# Patient Record
Sex: Male | Born: 1950 | Race: White | Hispanic: No | Marital: Single | Smoking: Never smoker
Health system: Southern US, Community
[De-identification: ages and names within clinical notes are randomized; demographics above are authoritative.]

## PROBLEM LIST (undated history)

## (undated) DIAGNOSIS — R350 Frequency of micturition: Secondary | ICD-10-CM

## (undated) DIAGNOSIS — R972 Elevated prostate specific antigen [PSA]: Secondary | ICD-10-CM

## (undated) DIAGNOSIS — C61 Malignant neoplasm of prostate: Secondary | ICD-10-CM

## (undated) DIAGNOSIS — R030 Elevated blood-pressure reading, without diagnosis of hypertension: Secondary | ICD-10-CM

## (undated) DIAGNOSIS — H9319 Tinnitus, unspecified ear: Secondary | ICD-10-CM

## (undated) DIAGNOSIS — I10 Essential (primary) hypertension: Secondary | ICD-10-CM

## (undated) DIAGNOSIS — N529 Male erectile dysfunction, unspecified: Secondary | ICD-10-CM

## (undated) DIAGNOSIS — H903 Sensorineural hearing loss, bilateral: Secondary | ICD-10-CM

## (undated) DIAGNOSIS — N200 Calculus of kidney: Secondary | ICD-10-CM

## (undated) DIAGNOSIS — R3915 Urgency of urination: Secondary | ICD-10-CM

## (undated) DIAGNOSIS — Z974 Presence of external hearing-aid: Secondary | ICD-10-CM

## (undated) DIAGNOSIS — R351 Nocturia: Secondary | ICD-10-CM

## (undated) DIAGNOSIS — Z9289 Personal history of other medical treatment: Secondary | ICD-10-CM

## (undated) DIAGNOSIS — N401 Enlarged prostate with lower urinary tract symptoms: Secondary | ICD-10-CM

## (undated) DIAGNOSIS — J342 Deviated nasal septum: Secondary | ICD-10-CM

## (undated) DIAGNOSIS — Z973 Presence of spectacles and contact lenses: Secondary | ICD-10-CM

## (undated) HISTORY — PX: PROSTATE BIOPSY: SHX241

## (undated) HISTORY — PX: TONSILLECTOMY: SUR1361

## (undated) HISTORY — PX: CYSTOSCOPY: SUR368

## (undated) HISTORY — PX: LITHOTRIPSY: SUR834

---

## 1998-01-08 HISTORY — PX: HERNIA REPAIR: SHX51

## 2003-04-21 ENCOUNTER — Emergency Department (HOSPITAL_COMMUNITY): Admission: EM | Admit: 2003-04-21 | Discharge: 2003-04-21 | Payer: Self-pay | Admitting: Emergency Medicine

## 2005-04-08 IMAGING — CT CT HEAD W/O CM
1 series · 16 of 28 positions shown, 20 images · non-contrast
Comparison: none

CLINICAL DATA: Syncopal episode. 
 CT OF THE BRAIN WITHOUT CONTRAST
 No prior studies for comparison: 
 Routine non-contrast head CT was performed. 

 There is no evidence of intracranial hemorrhage, brain edema, or mass effect. The ventricles are normal. No extra-axial abnormalities are identified. Bone windows show no significant abnormalities.
 IMPRESSION
 Negative non-contrast head CT.

[Series 2: brain · axial · 0.47mm/px · z∈[-7,+122]mm · 16 of 28 slices shown, 20 images]
[im 2/28  brain]
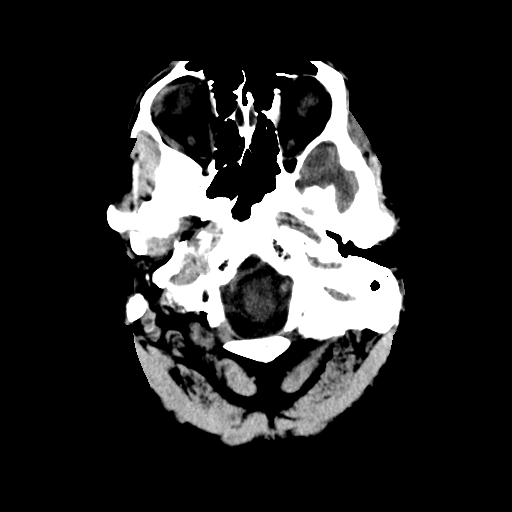
[im 2/28  bone]
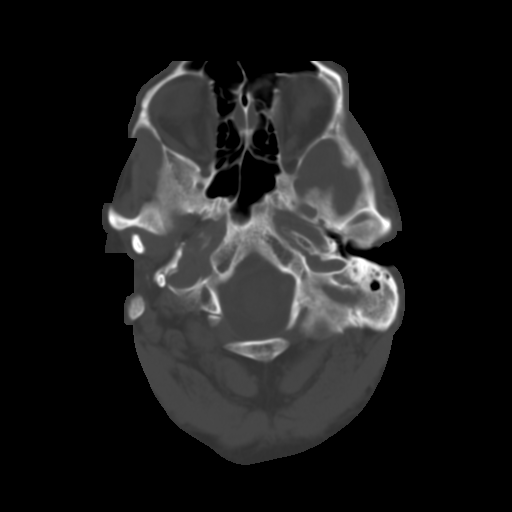
[im 4/28  brain]
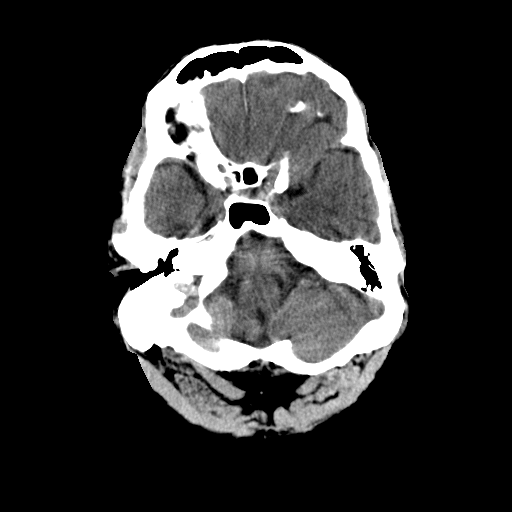
[im 6/28  brain]
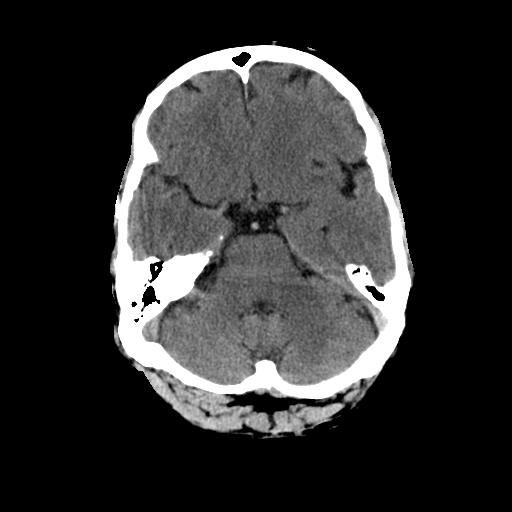
[im 7/28  brain]
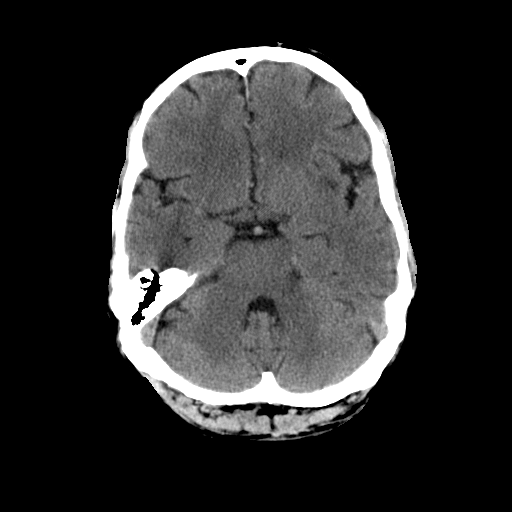
[im 9/28  brain]
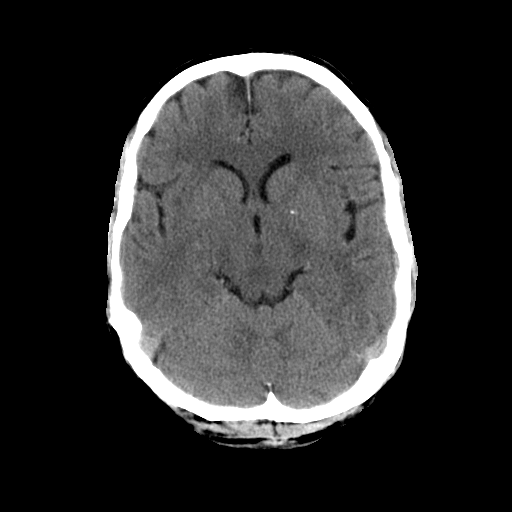
[im 9/28  bone]
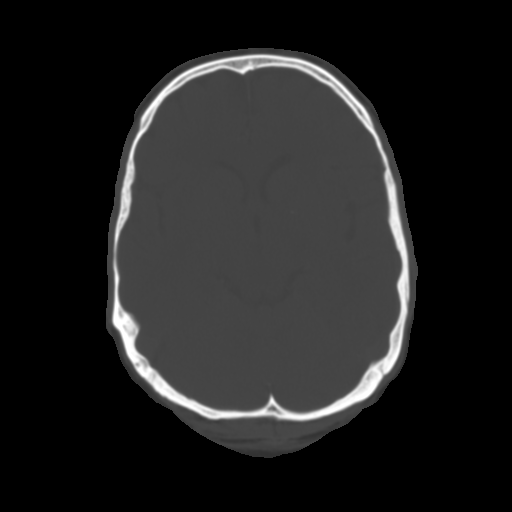
[im 10/28  brain]
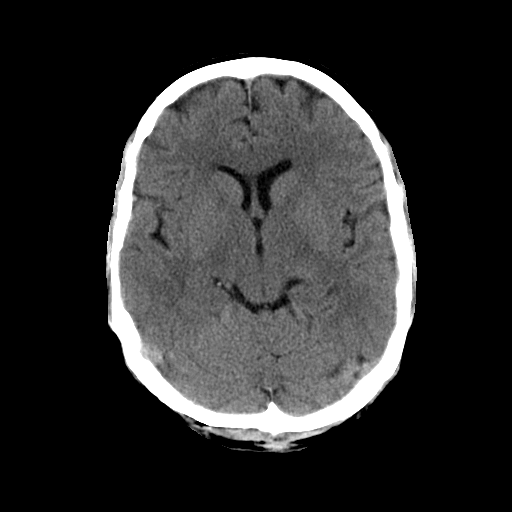
[im 12/28  brain]
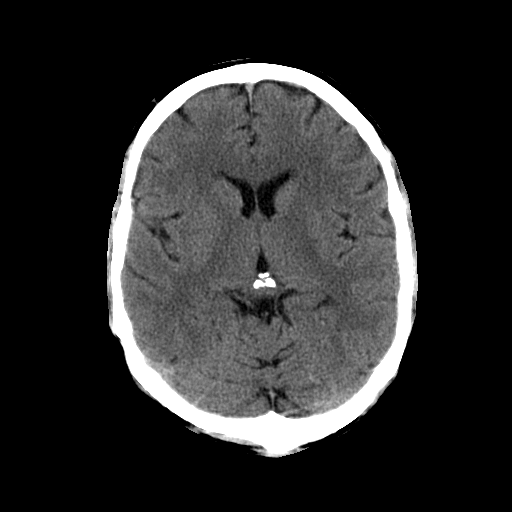
[im 14/28  brain]
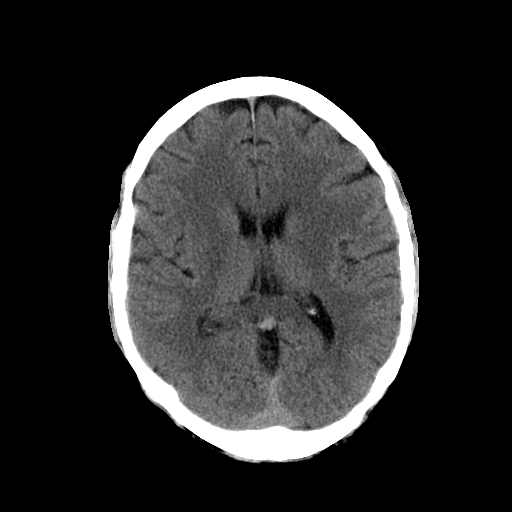
[im 15/28  brain]
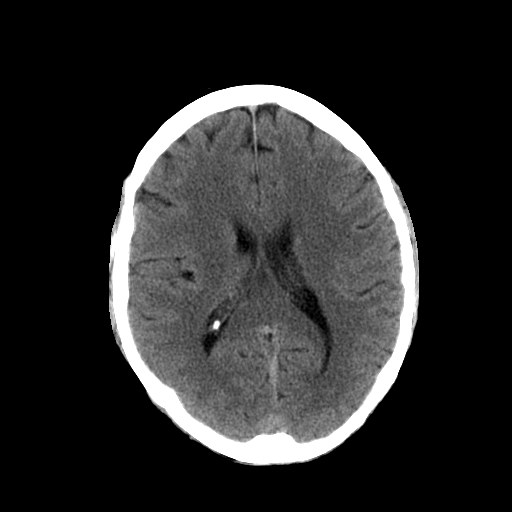
[im 15/28  bone]
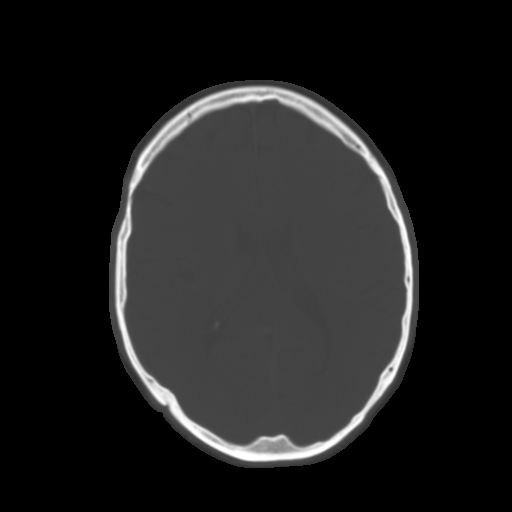
[im 17/28  brain]
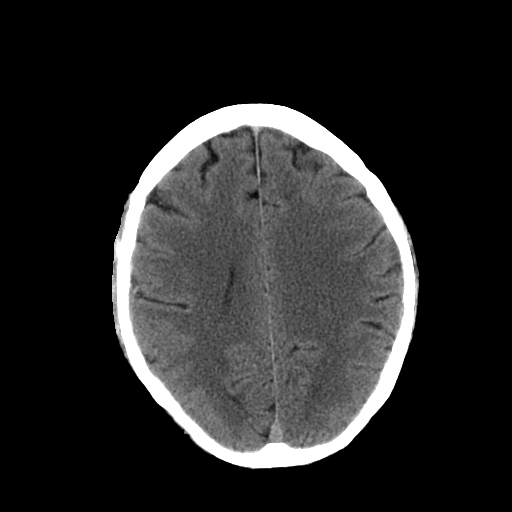
[im 19/28  brain]
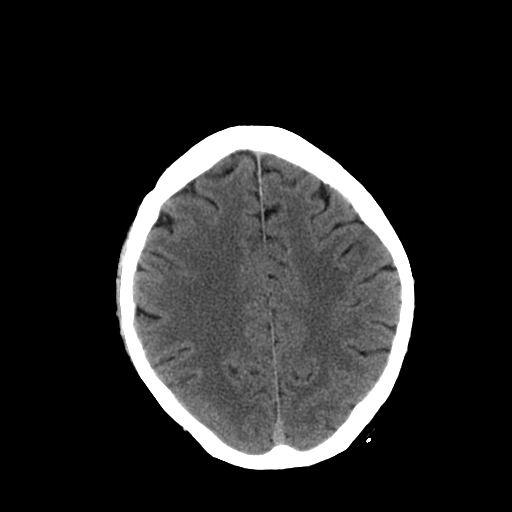
[im 20/28  brain]
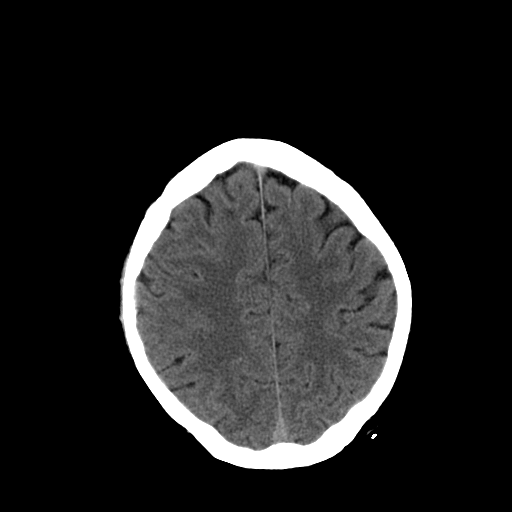
[im 22/28  brain]
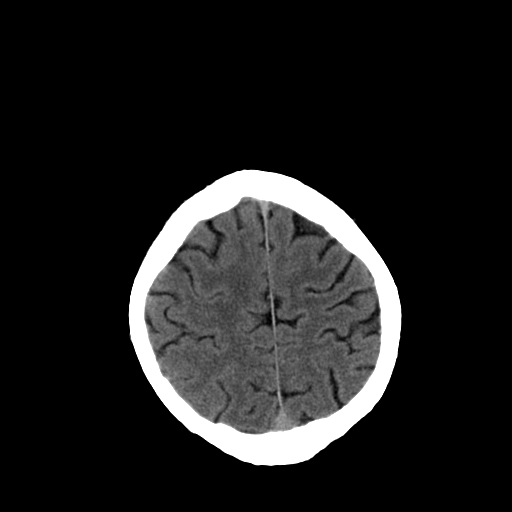
[im 22/28  bone]
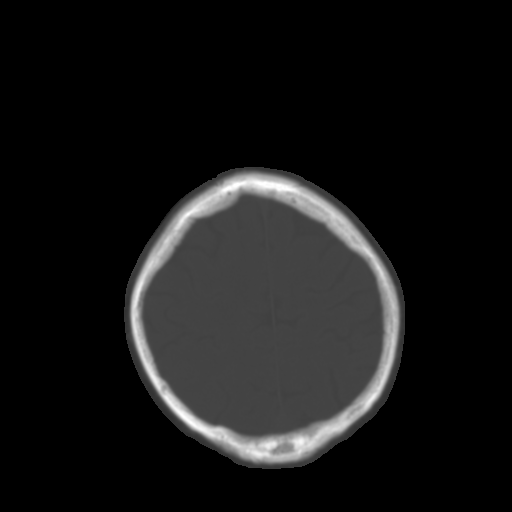
[im 23/28  brain]
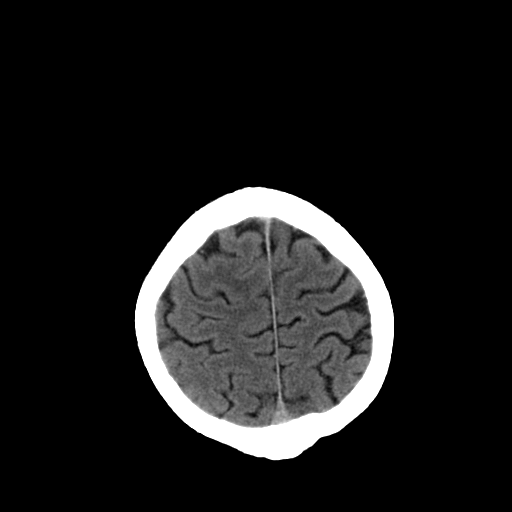
[im 25/28  brain]
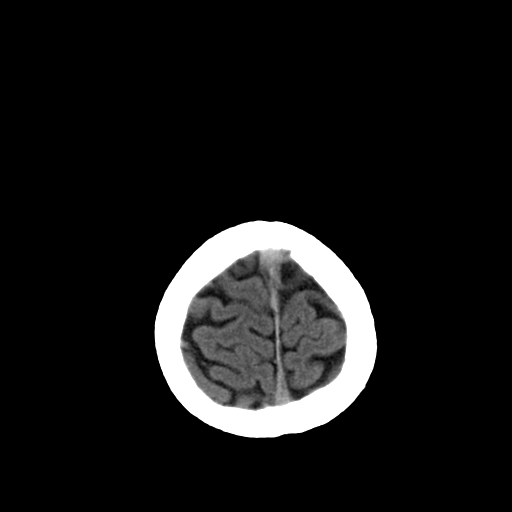
[im 27/28  brain]
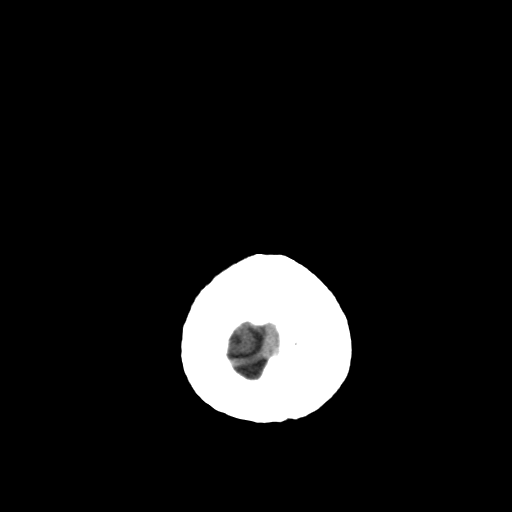

[16 of 28 positions shown; findings below may reference images not displayed]

## 2012-10-14 DIAGNOSIS — R351 Nocturia: Secondary | ICD-10-CM | POA: Insufficient documentation

## 2012-10-14 DIAGNOSIS — N200 Calculus of kidney: Secondary | ICD-10-CM | POA: Insufficient documentation

## 2012-10-14 DIAGNOSIS — N529 Male erectile dysfunction, unspecified: Secondary | ICD-10-CM | POA: Insufficient documentation

## 2014-03-23 DIAGNOSIS — N210 Calculus in bladder: Secondary | ICD-10-CM | POA: Insufficient documentation

## 2015-03-21 DIAGNOSIS — Z125 Encounter for screening for malignant neoplasm of prostate: Secondary | ICD-10-CM | POA: Insufficient documentation

## 2015-06-15 DIAGNOSIS — R0989 Other specified symptoms and signs involving the circulatory and respiratory systems: Secondary | ICD-10-CM | POA: Insufficient documentation

## 2015-10-19 DIAGNOSIS — K922 Gastrointestinal hemorrhage, unspecified: Secondary | ICD-10-CM | POA: Insufficient documentation

## 2016-07-24 DIAGNOSIS — R972 Elevated prostate specific antigen [PSA]: Secondary | ICD-10-CM | POA: Insufficient documentation

## 2022-08-01 ENCOUNTER — Telehealth: Payer: Self-pay | Admitting: Radiation Oncology

## 2022-08-01 NOTE — Telephone Encounter (Signed)
Called patient to schedule a consultation w. Dr. Manning. No answer, LVM for a return call.  ?

## 2022-08-06 ENCOUNTER — Encounter: Payer: Self-pay | Admitting: Radiation Oncology

## 2022-08-06 DIAGNOSIS — H9190 Unspecified hearing loss, unspecified ear: Secondary | ICD-10-CM | POA: Insufficient documentation

## 2022-08-06 DIAGNOSIS — R3129 Other microscopic hematuria: Secondary | ICD-10-CM | POA: Insufficient documentation

## 2022-08-06 DIAGNOSIS — E785 Hyperlipidemia, unspecified: Secondary | ICD-10-CM | POA: Insufficient documentation

## 2022-08-06 DIAGNOSIS — F32A Depression, unspecified: Secondary | ICD-10-CM | POA: Insufficient documentation

## 2022-08-06 NOTE — Progress Notes (Signed)
GU Location of Tumor / Histology: Prostate Cancer  If Prostate Cancer, Gleason Score is (3 + 4) and PSA is (6.0 on 05/25/2022)  Biopsies    Past/Anticipated interventions by urology, if any:   Dr. Debroah Baller   Past/Anticipated interventions by medical oncology, if any: NA  Weight changes, if any: {:18581}  IPSS: SHIM:  Bowel/Bladder complaints, if any: {:18581}   Nausea/Vomiting, if any: {:18581}  Pain issues, if any:  {:18581}  SAFETY ISSUES: Prior radiation? {:18581} Pacemaker/ICD? {:18581} Possible current pregnancy? Male Is the patient on methotrexate? No  Current Complaints / other details:

## 2022-08-08 ENCOUNTER — Ambulatory Visit
Admission: RE | Admit: 2022-08-08 | Discharge: 2022-08-08 | Discharge status: Home / Self Care | Payer: Medicare Other | Source: Ambulatory Visit | Attending: Radiation Oncology | Admitting: Radiation Oncology

## 2022-08-08 ENCOUNTER — Ambulatory Visit
Admission: RE | Admit: 2022-08-08 | Discharge: 2022-08-08 | Disposition: A | Discharge status: Home / Self Care | Payer: Medicare Other | Source: Ambulatory Visit | Attending: Radiation Oncology | Admitting: Radiation Oncology

## 2022-08-08 ENCOUNTER — Encounter: Payer: Self-pay | Admitting: Radiation Oncology

## 2022-08-08 VITALS — BP 144/77 | HR 80 | Temp 97.7°F | Resp 20 | Ht 69.0 in | Wt 202.0 lb

## 2022-08-08 DIAGNOSIS — C61 Malignant neoplasm of prostate: Secondary | ICD-10-CM

## 2022-08-08 DIAGNOSIS — Z79899 Other long term (current) drug therapy: Secondary | ICD-10-CM | POA: Insufficient documentation

## 2022-08-08 DIAGNOSIS — I10 Essential (primary) hypertension: Secondary | ICD-10-CM | POA: Diagnosis not present

## 2022-08-08 DIAGNOSIS — Z803 Family history of malignant neoplasm of breast: Secondary | ICD-10-CM | POA: Insufficient documentation

## 2022-08-08 DIAGNOSIS — Z801 Family history of malignant neoplasm of trachea, bronchus and lung: Secondary | ICD-10-CM | POA: Insufficient documentation

## 2022-08-08 DIAGNOSIS — Z87442 Personal history of urinary calculi: Secondary | ICD-10-CM | POA: Insufficient documentation

## 2022-08-08 HISTORY — DX: Essential (primary) hypertension: I10

## 2022-08-08 HISTORY — DX: Tinnitus, unspecified ear: H93.19

## 2022-08-08 HISTORY — DX: Deviated nasal septum: J34.2

## 2022-08-08 HISTORY — DX: Elevated prostate specific antigen (PSA): R97.20

## 2022-08-08 HISTORY — DX: Benign prostatic hyperplasia with lower urinary tract symptoms: N40.1

## 2022-08-08 HISTORY — DX: Frequency of micturition: R35.0

## 2022-08-08 HISTORY — DX: Male erectile dysfunction, unspecified: N52.9

## 2022-08-08 HISTORY — DX: Sensorineural hearing loss, bilateral: H90.3

## 2022-08-08 HISTORY — DX: Urgency of urination: R39.15

## 2022-08-08 HISTORY — DX: Calculus of kidney: N20.0

## 2022-08-08 HISTORY — DX: Nocturia: R35.1

## 2022-08-08 HISTORY — DX: Elevated blood-pressure reading, without diagnosis of hypertension: R03.0

## 2022-08-08 NOTE — Progress Notes (Signed)
Radiation Oncology         [phone] ________________________________  Initial Outpatient Consultation  Name: Russell Ho MRN: [id number]  Date: 08/08/2022  DOB: 1950/04/13  CC:No primary care provider on file.  Debroah Baller, MD   REFERRING PHYSICIAN: Debroah Baller, MD  DIAGNOSIS: 72 y.o. gentleman with Stage T1c adenocarcinoma of the prostate with Gleason score of 3+4, and PSA of 6.0.    ICD-10-CM   1. Malignant neoplasm of prostate (HCC)  C61       HISTORY OF PRESENT ILLNESS: Russell Ho is a 72 y.o. male with a diagnosis of prostate cancer. He was noted to have a rising PSA by his primary care physician. PSA on 04/11/22 was 5.3 and was 6.0 on 05/25/22. Accordingly, he was referred for evaluation in urology by Dr. Saddie Benders on who recommended a prostate biopsy.  The patient proceeded to transrectal ultrasound with 12 biopsies of the prostate on 06/21/22.  The prostate volume was calculated using the ellipsoid technique to measure 71 cc.  Out of 12 core biopsies, 3 were positive.  The maximum Gleason score was 3+4, and this was seen in right apex. Additionally, a Gleason score of 3+3 was seen in the right apex and left base. Patient proceeded with a CT of the abdomen and pelvis on 08/01/22 which showed an enlarged prostate, but no evidence of metastatic disease.  The patient reviewed the biopsy results with his urologist and he has kindly been referred today for discussion of potential radiation treatment options.   PREVIOUS RADIATION THERAPY: No  PAST MEDICAL HISTORY:  Past Medical History:  Diagnosis Date   Benign prostatic hyperplasia with lower urinary tract symptoms    Deviated nasal septum    ED (erectile dysfunction)    Elevated blood pressure reading    Elevated PSA    Hypertension    Kidney stones    Nocturia    Sensorineural hearing loss (SNHL), bilateral    Tinnitus    Urinary frequency    Urinary urgency       PAST SURGICAL HISTORY: Past Surgical  History:  Procedure Laterality Date   HERNIA REPAIR  2000   1954 and 1994   LITHOTRIPSY     PROSTATE BIOPSY     TONSILLECTOMY      FAMILY HISTORY:  Family History  Problem Relation Age of Onset   Lung cancer Father    Heart attack Brother    Colon cancer Brother    Heart attack Maternal Grandmother    Breast cancer Maternal Grandmother    Heart attack Paternal Grandmother     SOCIAL HISTORY:  Social History   Socioeconomic History   Marital status: Married    Spouse name: Not on file   Number of children: Not on file   Years of education: Not on file   Highest education level: Not on file  Occupational History   Not on file  Tobacco Use   Smoking status: Never   Smokeless tobacco: Never  Vaping Use   Vaping status: Never Used  Substance and Sexual Activity   Alcohol use: Not Currently    Comment: rare   Drug use: Never   Sexual activity: Not on file  Other Topics Concern   Not on file  Social History Narrative   Not on file   Social Determinants of Health   Financial Resource Strain: Not on file  Food Insecurity: No Food Insecurity (08/08/2022)   Hunger Vital Sign    Worried About  Running Out of Food in the Last Year: Never true    Ran Out of Food in the Last Year: Never true  Transportation Needs: No Transportation Needs (08/08/2022)   PRAPARE - Administrator, Civil Service (Medical): No    Lack of Transportation (Non-Medical): No  Physical Activity: Not on file  Stress: Not on file  Social Connections: Not on file  Intimate Partner Violence: Not At Risk (08/08/2022)   Humiliation, Afraid, Rape, and Kick questionnaire    Fear of Current or Ex-Partner: No    Emotionally Abused: No    Physically Abused: No    Sexually Abused: No    ALLERGIES: Other  MEDICATIONS:  Current Outpatient Medications  Medication Sig Dispense Refill   amLODipine (NORVASC) 10 MG tablet Take 10 mg by mouth daily.     atorvastatin (LIPITOR) 10 MG tablet Take 10  mg by mouth daily.     dutasteride (AVODART) 0.5 MG capsule Take 0.5 mg by mouth daily.     Multiple Vitamin (MULTIVITAMIN) tablet Take 1 tablet by mouth daily.     tamsulosin (FLOMAX) 0.4 MG CAPS capsule SMARTSIG:1 Capsule(s) By Mouth Every Evening     No current facility-administered medications for this encounter.    REVIEW OF SYSTEMS:  On review of systems, the patient reports that he is doing well overall. He denies any chest pain, shortness of breath, cough, fevers, chills, night sweats, unintended weight changes. He denies any bowel disturbances, and denies abdominal pain, nausea or vomiting. He denies any new musculoskeletal or joint aches or pains. His IPSS was 3 indicating mild urinary symptoms.  His SHIM was 10,  indicating he does have moderate erectile dysfunction. A complete review of systems is obtained and is otherwise negative.   PHYSICAL EXAM:  Wt Readings from Last 3 Encounters:  08/08/22 202 lb (91.6 kg)   Temp Readings from Last 3 Encounters:  08/08/22 97.7 F (36.5 C) (Temporal)   BP Readings from Last 3 Encounters:  08/08/22 (!) 144/77   Pulse Readings from Last 3 Encounters:  08/08/22 80   Pain Assessment Pain Score: 0-No pain/10  In general this is a well appearing gentleman in no acute distress. He's alert and oriented x4 and appropriate throughout the examination. Cardiopulmonary assessment is negative for acute distress, and he exhibits normal effort.    KPS = 100  100 - Normal; no complaints; no evidence of disease. 90   - Able to carry on normal activity; minor signs or symptoms of disease. 80   - Normal activity with effort; some signs or symptoms of disease. 74   - Cares for self; unable to carry on normal activity or to do active work. 60   - Requires occasional assistance, but is able to care for most of his personal needs. 50   - Requires considerable assistance and frequent medical care. 40   - Disabled; requires special care and  assistance. 30   - Severely disabled; hospital admission is indicated although death not imminent. 20   - Very sick; hospital admission necessary; active supportive treatment necessary. 10   - Moribund; fatal processes progressing rapidly. 0     - Dead  Karnofsky DA, Abelmann WH, Craver LS and Burchenal JH [phone]) The use of the nitrogen mustards in the palliative treatment of carcinoma: with particular reference to bronchogenic carcinoma Cancer 1 634-56  LABORATORY DATA:  No results found for: "WBC", "HGB", "HCT", "MCV", "PLT" No results found for: "NA", "K", "CL", "CO2"  No results found for: "ALT", "AST", "GGT", "ALKPHOS", "BILITOT"   RADIOGRAPHY: No results found.    IMPRESSION/PLAN: 1. 72 y.o. gentleman with Stage T1c adenocarcinoma of the prostate with Gleason Score of 3+4, and PSA of 6.0.  We discussed the patient's workup and outlined the nature of prostate cancer in this setting. The patient's T stage, Gleason's score, and PSA put him into the favorable-intermediate risk group. Accordingly, he is eligible for a variety of potential treatment options including brachytherapy, 5.5-8 weeks of external radiation, or prostatectomy. We discussed the available radiation techniques, and focused on the details and logistics of delivery. The patient may not be an ideal candidate for brachytherapy boost with a prostate volume of 71 cc prior to downsizing from hormone therapy. We discussed that based on his prostate volume, he would require beginning treatment with a 5 alpha reductase inhibitor for at least 3 months to allow for downsizing of the prostate prior to initiating radiotherapy. We discussed and outlined the risks, benefits, short and long-term effects associated with radiotherapy and compared and contrasted these with prostatectomy. We discussed the role of SpaceOAR gel in reducing the rectal toxicity associated with radiotherapy.  He appears to have a good understanding of his disease and our  treatment recommendations which are of curative intent.  He was encouraged to ask questions that were answered to his stated satisfaction.  At the conclusion of our conversation, the patient is interested in moving forward with brachytherapy. He started dutasteride on 07/31/22. We will share our discussion with Dr. Saddie Benders and coordinate with his team to arrange his brachytherapy in 3 months. Patient knows to call with any questions or concerns in the meantime.    We personally spent 60 minutes in this encounter including chart review, reviewing radiological studies, meeting face-to-face with the patient, entering orders and completing documentation.     Joyice Faster, PA-C    Margaretmary Dys, MD  [facility]  Radiation Oncology Direct Dial: [phone]  Fax: [phone] [email or web address]  Skype  LinkedIn

## 2022-08-14 NOTE — Progress Notes (Signed)
RN left message for call back to assess any navigation needs prior to brachytherapy.

## 2022-08-15 ENCOUNTER — Telehealth: Payer: Self-pay | Admitting: *Deleted

## 2022-08-15 NOTE — Telephone Encounter (Signed)
Called patient to inform of pre-seed appts.on 09-20-22, and his implant date for 10-11-22, spoke with patient and he is aware of these appts.

## 2022-08-22 ENCOUNTER — Ambulatory Visit: Payer: Self-pay

## 2022-08-22 ENCOUNTER — Ambulatory Visit: Payer: Self-pay | Admitting: Radiation Oncology

## 2022-09-18 ENCOUNTER — Telehealth: Payer: Self-pay | Admitting: *Deleted

## 2022-09-18 NOTE — Telephone Encounter (Signed)
Returned patient's phone call, spoke with patient 

## 2022-09-19 NOTE — Progress Notes (Signed)
Radiation Oncology         [phone] ________________________________  Name: Russell Ho MRN: [id number]  Date: 09/20/2022  DOB: 1950/07/28  SIMULATION AND TREATMENT PLANNING NOTE PUBIC ARCH STUDY  CC:No primary care provider on file.  Debroah Baller, MD  DIAGNOSIS:  72 y.o. gentleman with Stage T1c adenocarcinoma of the prostate with Gleason score of 3+4, and PSA of 6.0.   Oncology History  Malignant neoplasm of prostate (HCC)  06/21/2022 Cancer Staging   Staging form: Prostate, AJCC 8th Edition - Clinical stage from 06/21/2022: Stage IIB (cT1c, cN0, cM0, PSA: 6, Grade Group: 2) - Signed by Marcello Fennel, PA-C on 09/19/2022 Histopathologic type: Adenocarcinoma, NOS Stage prefix: Initial diagnosis Prostate specific antigen (PSA) range: Less than 10 Gleason primary pattern: 3 Gleason secondary pattern: 4 Gleason score: 7 Histologic grading system: 5 grade system Number of biopsy cores examined: 12 Number of biopsy cores positive: 3 Location of positive needle core biopsies: Both sides   08/08/2022 Initial Diagnosis   Malignant neoplasm of prostate (HCC)       ICD-10-CM   1. Malignant neoplasm of prostate (HCC)  C61       COMPLEX SIMULATION:  The patient presented today for evaluation for possible prostate seed implant. He was brought to the radiation planning suite and placed supine on the CT couch. A 3-dimensional image study set was obtained in upload to the planning computer. There, on each axial slice, I contoured the prostate gland. Then, using three-dimensional radiation planning tools I reconstructed the prostate in view of the structures from the transperineal needle pathway to assess for possible pubic arch interference. In doing so, I did not appreciate any pubic arch interference. Also, the patient's prostate volume was estimated based on the drawn structure. The volume was 68 cc.  Given the pubic arch appearance and prostate volume, patient remains a good  candidate to proceed with prostate seed implant. Today, he freely provided informed written consent to proceed.    PLAN: The patient will undergo prostate seed implant.   ________________________________  Artist Pais. Kathrynn Running, M.D.

## 2022-09-19 NOTE — Progress Notes (Signed)
Radiation Oncology         [phone] ________________________________  Outpatient Follow up- Pre-seed visit  Name: BALTAZAR PEROTTI MRN: [id number]  Date: 09/20/2022  DOB: [birth year]  CC:No primary care provider on file.  Debroah Baller, MD   REFERRING PHYSICIAN: Debroah Baller, MD  DIAGNOSIS: 72 y.o. gentleman with Stage T1c adenocarcinoma of the prostate with Gleason score of 3+4, and PSA of 6.0.     ICD-10-CM   1. Malignant neoplasm of prostate (HCC)  C61       HISTORY OF PRESENT ILLNESS: DEAUNDRE SCHOENROCK is a 72 y.o. male with a diagnosis of prostate cancer. He was noted to have a rising PSA by his primary care physician. PSA on 04/11/22 was 5.3 and was 6.0 on 05/25/22. Accordingly, he was referred for evaluation in urology by Dr. Saddie Benders on who recommended a prostate biopsy.  The patient proceeded to transrectal ultrasound with 12 biopsies of the prostate on 06/21/22.  The prostate volume was calculated using the ellipsoid technique to measure 71 cc.  Out of 12 core biopsies, 3 were positive.  The maximum Gleason score was 3+4, and this was seen in right apex. Additionally, a Gleason score of 3+3 was seen in the right apex and left base. Patient proceeded with a CT of the abdomen and pelvis on 08/01/22 which showed an enlarged prostate, but no evidence of metastatic disease.   The patient reviewed the biopsy results with his urologist and was kindly referred to [place] for discussion of potential radiation treatment options. We initially met the patient on 08/08/22 and he was most interested in proceeding with brachytherapy and SpaceOAR gel placement for treatment of his disease. He is here today for his pre-procedure imaging for planning and to answer any additional questions he may have about this treatment.   PREVIOUS RADIATION THERAPY: No  PAST MEDICAL HISTORY:  Past Medical History:  Diagnosis Date   Benign prostatic hyperplasia with lower urinary tract symptoms    Deviated nasal septum     ED (erectile dysfunction)    Elevated blood pressure reading    Elevated PSA    Hypertension    Kidney stones    Nocturia    Sensorineural hearing loss (SNHL), bilateral    Tinnitus    Urinary frequency    Urinary urgency       PAST SURGICAL HISTORY: Past Surgical History:  Procedure Laterality Date   HERNIA REPAIR  2000   1954 and 1994   LITHOTRIPSY     PROSTATE BIOPSY     TONSILLECTOMY      FAMILY HISTORY:  Family History  Problem Relation Age of Onset   Lung cancer Father    Heart attack Brother    Colon cancer Brother    Heart attack Maternal Grandmother    Breast cancer Maternal Grandmother    Heart attack Paternal Grandmother     SOCIAL HISTORY:  Social History   Socioeconomic History   Marital status: Married    Spouse name: Not on file   Number of children: Not on file   Years of education: Not on file   Highest education level: Not on file  Occupational History   Not on file  Tobacco Use   Smoking status: Never   Smokeless tobacco: Never  Vaping Use   Vaping status: Never Used  Substance and Sexual Activity   Alcohol use: Not Currently    Comment: rare   Drug use: Never   Sexual activity: Not on  file  Other Topics Concern   Not on file  Social History Narrative   Not on file   Social Determinants of Health   Financial Resource Strain: Not on file  Food Insecurity: No Food Insecurity (08/08/2022)   Hunger Vital Sign    Worried About Running Out of Food in the Last Year: Never true    Ran Out of Food in the Last Year: Never true  Transportation Needs: No Transportation Needs (08/08/2022)   PRAPARE - Administrator, Civil Service (Medical): No    Lack of Transportation (Non-Medical): No  Physical Activity: Not on file  Stress: Not on file  Social Connections: Not on file  Intimate Partner Violence: Not At Risk (08/08/2022)   Humiliation, Afraid, Rape, and Kick questionnaire    Fear of Current or Ex-Partner: No     Emotionally Abused: No    Physically Abused: No    Sexually Abused: No    ALLERGIES: Other  MEDICATIONS:  Current Outpatient Medications  Medication Sig Dispense Refill   amLODipine (NORVASC) 10 MG tablet Take 10 mg by mouth daily.     atorvastatin (LIPITOR) 10 MG tablet Take 10 mg by mouth daily.     dutasteride (AVODART) 0.5 MG capsule Take 0.5 mg by mouth daily.     Multiple Vitamin (MULTIVITAMIN) tablet Take 1 tablet by mouth daily.     tamsulosin (FLOMAX) 0.4 MG CAPS capsule SMARTSIG:1 Capsule(s) By Mouth Every Evening     No current facility-administered medications for this visit.    REVIEW OF SYSTEMS:   On review of systems, the patient reports that he is doing well overall. He denies any chest pain, shortness of breath, cough, fevers, chills, night sweats, unintended weight changes. He denies any bowel disturbances, and denies abdominal pain, nausea or vomiting. He denies any new musculoskeletal or joint aches or pains. His IPSS was 3 indicating mild urinary symptoms.  His SHIM was 10,  indicating he does have moderate erectile dysfunction. A complete review of systems is obtained and is otherwise negative.     PHYSICAL EXAM:  Wt Readings from Last 3 Encounters:  08/08/22 202 lb (91.6 kg)   Temp Readings from Last 3 Encounters:  08/08/22 97.7 F (36.5 C) (Temporal)   BP Readings from Last 3 Encounters:  08/08/22 (!) 144/77   Pulse Readings from Last 3 Encounters:  08/08/22 80    /10  In general this is a well appearing Caucasian male in no acute distress. He's alert and oriented x4 and appropriate throughout the examination. Cardiopulmonary assessment is negative for acute distress, and he exhibits normal effort.     KPS = 100  100 - Normal; no complaints; no evidence of disease. 90   - Able to carry on normal activity; minor signs or symptoms of disease. 80   - Normal activity with effort; some signs or symptoms of disease. 65   - Cares for self; unable to  carry on normal activity or to do active work. 60   - Requires occasional assistance, but is able to care for most of his personal needs. 50   - Requires considerable assistance and frequent medical care. 40   - Disabled; requires special care and assistance. 30   - Severely disabled; hospital admission is indicated although death not imminent. 20   - Very sick; hospital admission necessary; active supportive treatment necessary. 10   - Moribund; fatal processes progressing rapidly. 0     - Dead  Karnofsky  DA, Abelmann WH, Craver LS and Burchenal JH [phone]) The use of the nitrogen mustards in the palliative treatment of carcinoma: with particular reference to bronchogenic carcinoma Cancer 1 634-56  LABORATORY DATA:  No results found for: "WBC", "HGB", "HCT", "MCV", "PLT" No results found for: "NA", "K", "CL", "CO2" No results found for: "ALT", "AST", "GGT", "ALKPHOS", "BILITOT"   RADIOGRAPHY: No results found.    IMPRESSION/PLAN: 1. 72 y.o. gentleman with Stage T1c adenocarcinoma of the prostate with Gleason score of 3+4, and PSA of 6.0.  The patient has elected to proceed with seed implant for treatment of his disease. We reviewed the risks, benefits, short and long-term effects associated with brachytherapy and discussed the role of SpaceOAR in reducing the rectal toxicity associated with radiotherapy.  He appears to have a good understanding of his disease and our treatment recommendations which are of curative intent.  He was encouraged to ask questions that were answered to his stated satisfaction. He has freely signed written consent to proceed today in the office and a copy of this document will be placed in his medical record. His procedure is tentatively scheduled for 10/11/22 in collaboration with Dr. Saddie Benders and we will see him back for his post-procedure visit approximately 3 weeks thereafter. We look forward to continuing to participate in his care. He knows that he is welcome to call  with any questions or concerns at any time in the interim.  I personally spent 30 minutes in this encounter including chart review, reviewing radiological studies, meeting face-to-face with the patient, entering orders and completing documentation.    Marguarite Arbour, MMS, PA-C [place]  [facility] Radiation Oncology Physician Assistant Direct Dial: [phone]  Fax: [phone]

## 2022-09-20 ENCOUNTER — Ambulatory Visit
Admission: RE | Admit: 2022-09-20 | Discharge: 2022-09-20 | Disposition: A | Discharge status: Home / Self Care | Payer: Medicare Other | Source: Ambulatory Visit | Attending: Radiation Oncology | Admitting: Radiation Oncology

## 2022-09-20 ENCOUNTER — Ambulatory Visit
Admission: RE | Admit: 2022-09-20 | Discharge: 2022-09-20 | Disposition: A | Discharge status: Home / Self Care | Payer: 59 | Source: Ambulatory Visit | Attending: Urology | Admitting: Urology

## 2022-09-20 ENCOUNTER — Other Ambulatory Visit: Payer: Self-pay

## 2022-09-20 ENCOUNTER — Encounter (HOSPITAL_COMMUNITY)
Admission: RE | Admit: 2022-09-20 | Discharge: 2022-09-20 | Disposition: A | Discharge status: Home / Self Care | Payer: Medicare Other | Source: Ambulatory Visit | Attending: Radiation Oncology | Admitting: Radiation Oncology

## 2022-09-20 ENCOUNTER — Encounter: Payer: Self-pay | Admitting: Urology

## 2022-09-20 VITALS — Resp 19 | Ht 69.0 in | Wt 200.0 lb

## 2022-09-20 DIAGNOSIS — R9431 Abnormal electrocardiogram [ECG] [EKG]: Secondary | ICD-10-CM | POA: Insufficient documentation

## 2022-09-20 DIAGNOSIS — Z01810 Encounter for preprocedural cardiovascular examination: Secondary | ICD-10-CM | POA: Insufficient documentation

## 2022-09-20 DIAGNOSIS — Z01818 Encounter for other preprocedural examination: Secondary | ICD-10-CM | POA: Diagnosis present

## 2022-09-20 DIAGNOSIS — C61 Malignant neoplasm of prostate: Secondary | ICD-10-CM

## 2022-09-20 NOTE — Progress Notes (Signed)
Pre-seed nursing interview for a diagnosis of Stage T1c adenocarcinoma of the prostate with Gleason score of 3+4, and PSA of 6.0.  Patient identity verified x2.   Patient reports doing well. No issues conveyed at this time.  Meaningful use complete.  Urinary Management medication(s)- Tamsulosin Urology appointment date- 10/06/2022, with Dr. Saddie Benders at [facility]  Resp 19   Ht 5\' 9"  (1.753 m)   Wt 200 lb (90.7 kg)   BMI 29.53 kg/m   This concludes the interaction.  Ruel Favors, LPN

## 2022-10-02 ENCOUNTER — Other Ambulatory Visit: Payer: Self-pay

## 2022-10-02 ENCOUNTER — Encounter (HOSPITAL_BASED_OUTPATIENT_CLINIC_OR_DEPARTMENT_OTHER): Payer: Self-pay | Admitting: Urology

## 2022-10-02 NOTE — Progress Notes (Addendum)
Spoke w/ via phone for pre-op interview---pt Lab needs dos---- I stat  surgery orders req dr Saddie Benders epic ib      Lab results------EKG 09-20-2022 epic COVID test -----patient states asymptomatic no test needed Arrive at -------1200 10-11-2022 NPO after MN NO Solid Food.  Clear liquids from MN until---1100 Med rec completed Medications to take morning of surgery -----amlodipine, atorvastatin, dutasteride Diabetic medication -----n/a Patient instructed no nail polish to be worn day of surgery Patient instructed to bring photo id and insurance card day of surgery Patient aware to have Driver (ride ) / caregiver   Russell Ho significant other  for 24 hours after surgery -  Patient Special Instructions -----fleets enema am of surgery Pre-Op special Instructions -----none Patient verbalized understanding of instructions that were given at this phone interview. Patient denies chest pain, sob, fever, cough at the interview.

## 2022-10-05 ENCOUNTER — Inpatient Hospital Stay: Payer: Medicare Other | Admitting: General Practice

## 2022-10-10 ENCOUNTER — Telehealth: Payer: Self-pay | Admitting: *Deleted

## 2022-10-10 NOTE — Telephone Encounter (Signed)
Called patient to remind of procedure for 10-11-22, lvm for a return call

## 2022-10-11 ENCOUNTER — Encounter (HOSPITAL_BASED_OUTPATIENT_CLINIC_OR_DEPARTMENT_OTHER)
Admission: RE | Disposition: A | Discharge status: Home / Self Care | Payer: Self-pay | Source: Home / Self Care | Attending: Urology

## 2022-10-11 ENCOUNTER — Ambulatory Visit (HOSPITAL_BASED_OUTPATIENT_CLINIC_OR_DEPARTMENT_OTHER)
Admission: RE | Admit: 2022-10-11 | Discharge: 2022-10-11 | Disposition: A | Discharge status: Home / Self Care | Payer: Medicare Other | Attending: Urology | Admitting: Urology

## 2022-10-11 ENCOUNTER — Ambulatory Visit (HOSPITAL_BASED_OUTPATIENT_CLINIC_OR_DEPARTMENT_OTHER): Payer: Medicare Other | Admitting: Anesthesiology

## 2022-10-11 ENCOUNTER — Encounter (HOSPITAL_BASED_OUTPATIENT_CLINIC_OR_DEPARTMENT_OTHER): Payer: Self-pay | Admitting: Urology

## 2022-10-11 ENCOUNTER — Telehealth: Payer: Self-pay | Admitting: *Deleted

## 2022-10-11 ENCOUNTER — Ambulatory Visit (HOSPITAL_COMMUNITY): Payer: Medicare Other

## 2022-10-11 DIAGNOSIS — C61 Malignant neoplasm of prostate: Secondary | ICD-10-CM | POA: Diagnosis not present

## 2022-10-11 DIAGNOSIS — Z01818 Encounter for other preprocedural examination: Secondary | ICD-10-CM

## 2022-10-11 HISTORY — DX: Personal history of other medical treatment: Z92.89

## 2022-10-11 HISTORY — DX: Presence of spectacles and contact lenses: Z97.3

## 2022-10-11 HISTORY — DX: Presence of external hearing-aid: Z97.4

## 2022-10-11 HISTORY — DX: Malignant neoplasm of prostate: C61

## 2022-10-11 HISTORY — PX: CYSTOSCOPY: SHX5120

## 2022-10-11 HISTORY — PX: TRANSRECTAL ULTRASOUND: SHX5146

## 2022-10-11 HISTORY — PX: RADIOACTIVE SEED IMPLANT: SHX5150

## 2022-10-11 LAB — URINALYSIS, ROUTINE W REFLEX MICROSCOPIC
Bacteria, UA: NONE SEEN
Bilirubin Urine: NEGATIVE
Glucose, UA: NEGATIVE mg/dL
Ketones, ur: NEGATIVE mg/dL
Leukocytes,Ua: NEGATIVE
Nitrite: NEGATIVE
Protein, ur: 30 mg/dL — AB
Specific Gravity, Urine: 1.012 (ref 1.005–1.030)
pH: 7 (ref 5.0–8.0)

## 2022-10-11 LAB — POCT I-STAT, CHEM 8
BUN: 12 mg/dL (ref 8–23)
Calcium, Ion: 1.19 mmol/L (ref 1.15–1.40)
Chloride: 108 mmol/L (ref 98–111)
Creatinine, Ser: 1.1 mg/dL (ref 0.61–1.24)
Glucose, Bld: 114 mg/dL — ABNORMAL HIGH (ref 70–99)
HCT: 47 % (ref 39.0–52.0)
Hemoglobin: 16 g/dL (ref 13.0–17.0)
Potassium: 4 mmol/L (ref 3.5–5.1)
Sodium: 142 mmol/L (ref 135–145)
TCO2: 22 mmol/L (ref 22–32)

## 2022-10-11 SURGERY — INSERTION, RADIATION SOURCE, PROSTATE
Anesthesia: General | Site: Urethra

## 2022-10-11 MED ORDER — DEXAMETHASONE SODIUM PHOSPHATE 10 MG/ML IJ SOLN
INTRAMUSCULAR | Status: AC
  Filled 2022-10-11: qty 1

## 2022-10-11 MED ORDER — FENTANYL CITRATE (PF) 100 MCG/2ML IJ SOLN
INTRAMUSCULAR | Status: AC
  Filled 2022-10-11: qty 2

## 2022-10-11 MED ORDER — PROPOFOL 10 MG/ML IV BOLUS
INTRAVENOUS | Status: DC | PRN
  Administered 2022-10-11: 150 mg via INTRAVENOUS
  Administered 2022-10-11: 20 mg via INTRAVENOUS

## 2022-10-11 MED ORDER — ROCURONIUM BROMIDE 10 MG/ML (PF) SYRINGE
PREFILLED_SYRINGE | INTRAVENOUS | Status: AC
  Filled 2022-10-11: qty 10

## 2022-10-11 MED ORDER — DEXMEDETOMIDINE HCL IN NACL 80 MCG/20ML IV SOLN
INTRAVENOUS | Status: DC | PRN
Start: 2022-10-11 — End: 2022-10-11
  Administered 2022-10-11 (×3): 4 ug via INTRAVENOUS

## 2022-10-11 MED ORDER — CIPROFLOXACIN IN D5W 400 MG/200ML IV SOLN
INTRAVENOUS | Status: AC
  Filled 2022-10-11: qty 200

## 2022-10-11 MED ORDER — ONDANSETRON HCL 4 MG/2ML IJ SOLN: 4.0000 mg | Freq: Once | INTRAMUSCULAR | Status: DC | PRN

## 2022-10-11 MED ORDER — FENTANYL CITRATE (PF) 100 MCG/2ML IJ SOLN
INTRAMUSCULAR | Status: DC | PRN
  Administered 2022-10-11: 100 ug via INTRAVENOUS

## 2022-10-11 MED ORDER — ACETAMINOPHEN 10 MG/ML IV SOLN: 1000.0000 mg | Freq: Once | INTRAVENOUS | Status: DC | PRN

## 2022-10-11 MED ORDER — DEXAMETHASONE SODIUM PHOSPHATE 10 MG/ML IJ SOLN
INTRAMUSCULAR | Status: DC | PRN
  Administered 2022-10-11: 5 mg via INTRAVENOUS

## 2022-10-11 MED ORDER — FENTANYL CITRATE (PF) 100 MCG/2ML IJ SOLN
25.0000 ug | INTRAMUSCULAR | Status: DC | PRN
  Administered 2022-10-11: 25 ug via INTRAVENOUS
  Administered 2022-10-11: 50 ug via INTRAVENOUS

## 2022-10-11 MED ORDER — OXYCODONE HCL 5 MG PO TABS: 5.0000 mg | ORAL_TABLET | Freq: Once | ORAL | Status: DC | PRN

## 2022-10-11 MED ORDER — OXYCODONE HCL 5 MG/5ML PO SOLN: 5.0000 mg | Freq: Once | ORAL | Status: DC | PRN

## 2022-10-11 MED ORDER — SODIUM CHLORIDE 0.9 % IR SOLN
Status: DC | PRN
  Administered 2022-10-11: 1000 mL

## 2022-10-11 MED ORDER — IOHEXOL 300 MG/ML  SOLN
INTRAMUSCULAR | Status: DC | PRN
  Administered 2022-10-11: 7 mL

## 2022-10-11 MED ORDER — LACTATED RINGERS IV SOLN: INTRAVENOUS | Status: DC

## 2022-10-11 MED ORDER — LIDOCAINE HCL (PF) 2 % IJ SOLN
INTRAMUSCULAR | Status: AC
  Filled 2022-10-11: qty 5

## 2022-10-11 MED ORDER — ROCURONIUM BROMIDE 10 MG/ML (PF) SYRINGE
PREFILLED_SYRINGE | INTRAVENOUS | Status: DC | PRN
  Administered 2022-10-11: 60 mg via INTRAVENOUS

## 2022-10-11 MED ORDER — ONDANSETRON HCL 4 MG/2ML IJ SOLN
INTRAMUSCULAR | Status: DC | PRN
  Administered 2022-10-11: 4 mg via INTRAVENOUS

## 2022-10-11 MED ORDER — ONDANSETRON HCL 4 MG/2ML IJ SOLN
INTRAMUSCULAR | Status: AC
  Filled 2022-10-11: qty 2

## 2022-10-11 MED ORDER — SUGAMMADEX SODIUM 200 MG/2ML IV SOLN
INTRAVENOUS | Status: DC | PRN
  Administered 2022-10-11: 190 mg via INTRAVENOUS

## 2022-10-11 MED ORDER — STERILE WATER FOR IRRIGATION IR SOLN
Status: DC | PRN
Start: 2022-10-11 — End: 2022-10-11
  Administered 2022-10-11: 500 mL

## 2022-10-11 MED ORDER — CIPROFLOXACIN IN D5W 400 MG/200ML IV SOLN
400.0000 mg | INTRAVENOUS | Status: AC
  Administered 2022-10-11: 400 mg via INTRAVENOUS

## 2022-10-11 MED ORDER — LIDOCAINE 2% (20 MG/ML) 5 ML SYRINGE
INTRAMUSCULAR | Status: DC | PRN
  Administered 2022-10-11: 100 mg via INTRAVENOUS

## 2022-10-11 SURGICAL SUPPLY — 34 items
BLADE CLIPPER SENSICLIP SURGIC (BLADE) ×3 IMPLANT
CATH FOLEY 2WAY SLVR 5CC 16FR (CATHETERS) ×6 IMPLANT
CATH ROBINSON RED A/P 20FR (CATHETERS) ×3 IMPLANT
COVER BACK TABLE 60X90IN (DRAPES) ×3 IMPLANT
COVER MAYO STAND STRL (DRAPES) ×3 IMPLANT
DRSG TEGADERM 4X4.75 (GAUZE/BANDAGES/DRESSINGS) ×3 IMPLANT
DRSG TEGADERM 8X12 (GAUZE/BANDAGES/DRESSINGS) ×3 IMPLANT
GAUZE SPONGE 4X4 12PLY STRL LF (GAUZE/BANDAGES/DRESSINGS) IMPLANT
GLOVE BIO SURGEON STRL SZ 6.5 (GLOVE) ×3 IMPLANT
GLOVE BIO SURGEON STRL SZ7.5 (GLOVE) ×6 IMPLANT
GLOVE BIOGEL PI IND STRL 6.5 (GLOVE) IMPLANT
GLOVE SURG ORTHO 8.5 STRL (GLOVE) IMPLANT
GLOVE SURG SS PI 6.5 STRL IVOR (GLOVE) IMPLANT
GLOVE SURG SYN 6.5 ES PF (GLOVE) ×6
GLOVE SURG SYN 6.5 PF PI (GLOVE) IMPLANT
GOWN STRL REUS W/ TWL LRG LVL3 (GOWN DISPOSABLE) IMPLANT
GOWN STRL REUS W/TWL LRG LVL3 (GOWN DISPOSABLE) ×6 IMPLANT
GRID BRACH TEMP 18GA 2.8X3X.75 (MISCELLANEOUS) ×3 IMPLANT
HOLDER FOLEY CATH W/STRAP (MISCELLANEOUS) ×3 IMPLANT
I-125 Seeds IMPLANT
IV NS 1000ML (IV SOLUTION) ×3
IV NS 1000ML BAXH (IV SOLUTION) ×3 IMPLANT
KIT TURNOVER CYSTO (KITS) ×3 IMPLANT
NDL BRACHY 18G 5PK (NEEDLE) ×12 IMPLANT
NDL PK MORGANSTERN STABILIZ (NEEDLE) ×3 IMPLANT
NEEDLE BRACHY 18G 5PK (NEEDLE) ×12
NEEDLE PK MORGANSTERN STABILIZ (NEEDLE) ×3
PACK CYSTO (CUSTOM PROCEDURE TRAY) ×3 IMPLANT
SHEATH ULTRASOUND LTX NONSTRL (SHEATH) IMPLANT
SLEEVE SCD COMPRESS KNEE MED (STOCKING) ×3 IMPLANT
SYR 10ML LL (SYRINGE) ×3 IMPLANT
TOWEL OR 17X24 6PK STRL BLUE (TOWEL DISPOSABLE) ×3 IMPLANT
UNDERPAD 30X36 HEAVY ABSORB (UNDERPADS AND DIAPERS) ×6 IMPLANT
WATER STERILE IRR 500ML POUR (IV SOLUTION) ×3 IMPLANT

## 2022-10-11 NOTE — Anesthesia Procedure Notes (Signed)
Procedure Name: Intubation Date/Time: 10/11/2022 2:43 PM  Performed by: Briant Sites, CRNAPre-anesthesia Checklist: Patient identified, Emergency Drugs available, Suction available and Patient being monitored Patient Re-evaluated:Patient Re-evaluated prior to induction Oxygen Delivery Method: Circle system utilized Preoxygenation: Pre-oxygenation with 100% oxygen Induction Type: IV induction Ventilation: Mask ventilation without difficulty Laryngoscope Size: Mac and 4 Grade View: Grade I Tube type: Oral Tube size: 7.5 mm Number of attempts: 1 Airway Equipment and Method: Stylet Placement Confirmation: ETT inserted through vocal cords under direct vision, positive ETCO2 and breath sounds checked- equal and bilateral Secured at: 21 cm Tube secured with: Tape Dental Injury: Teeth and Oropharynx as per pre-operative assessment

## 2022-10-11 NOTE — Op Note (Signed)
NAMEGAREY, Russell Ho MEDICAL RECORD NO: [id number] ACCOUNT NO: [id number] DATE OF BIRTH: [birth year] FACILITY: [facility] LOCATION: [facility] PHYSICIAN: Debroah Baller, MD  Operative Report   DATE OF PROCEDURE: 10/11/2022  PREOPERATIVE DIAGNOSIS:  Stage T1c adenocarcinoma of the prostate.  POSTOPERATIVE DIAGNOSIS:  Stage T1c adenocarcinoma of the prostate.  PROCEDURE:  Transrectal ultrasound of the prostate, I-125 radioactive intraprostatic seed implantation and cystoscopy.  SURGEON OF RECORD:  Dr. Debroah Baller and Dr. Margaretmary Dys.  HISTORY OF PRESENT ILLNESS:  The patient is a 72 year old male with a diagnosis of prostate cancer, maximum Gleason score 7.  He has reviewed his options and has opted for definitive therapy with radioactive seed implantation.  PROCEDURE IN DETAIL:  The patient was brought into the operating room and placed on the table in the supine position.  After adequate general anesthesia was achieved, the Foley catheter was placed to drain the bladder.  Then, the patient was carefully  positioned in exaggerated lithotomy with the Yellofin stirrups.  The scrotum was shaved, elevated and taped in place onto the lower abdomen.  The perineum was shaved, prepped and draped for the performance of the procedure.  A red rubber catheter was  then placed into the rectum to relieve excess air.  Transrectal ultrasound probe was placed and prostatic anchors placed.  The prostate was scanned from apex to seminal vesicles.  The scanned images went into the computer program, the Matheny  program.   The individual images were reviewed by myself and Dr. Kathrynn Running and the prostatic borders, urethra and rectum were appropriately marked.  The computer then calculated seed placement.  All of the individual images were then reviewed by myself and Dr.  Kathrynn Running to ascertain seed placement.  Once we had good coverage of the prostate with sparing of the urethra and rectum, seed implantation was begun.   The bladder neck was identified with contrast in the Foley balloon and noted to be in place.  Needles  were then placed from anterior to posterior on the prostate to encompass the prostate in its entirety.  No portion of the prostate was left untreated.  Once the final seeds were placed, the anchor needles were removed and pelvic x-ray was obtained.  This  showed good distribution of the seeds.  Foley catheter was then removed.  The patient was then reprepped and draped and flexible cystoscopy performed, which showed no seeds within the bladder.  Under sterile conditions, Foley catheter was replaced  16-French, to drain the bladder.  Rectal exam was then performed and this did not reveal any foreign bodies in the rectum.  A total of 63 seeds were implanted into the prostate.  The patient tolerated all of this well.  He was then awakened and taken to  the recovery room in good condition.   VAI D: 10/11/2022 3:55:04 pm T: 10/11/2022 9:25:00 pm  JOB: [id number]/ [id number]

## 2022-10-11 NOTE — Anesthesia Postprocedure Evaluation (Signed)
Anesthesia Post Note  Patient: Russell Ho  Procedure(s) Performed: RADIOACTIVE SEED IMPLANT/BRACHYTHERAPY IMPLANT (Prostate) CYSTOSCOPY (Urethra) TRANSRECTAL ULTRASOUND (Rectum)     Patient location during evaluation: PACU Anesthesia Type: General Level of consciousness: awake and alert Pain management: pain level controlled Vital Signs Assessment: post-procedure vital signs reviewed and stable Respiratory status: spontaneous breathing, nonlabored ventilation, respiratory function stable and patient connected to nasal cannula oxygen Cardiovascular status: blood pressure returned to baseline and stable Postop Assessment: no apparent nausea or vomiting Anesthetic complications: no  No notable events documented.  Last Vitals:  Vitals:   10/11/22 1550 10/11/22 1551  BP:    Pulse:  79  Resp: 18 12  Temp:  (!) 36.3 C  SpO2:  97%    Last Pain:  Vitals:   10/11/22 1200  TempSrc: Oral  PainSc: 0-No pain                 Russell Ho S

## 2022-10-11 NOTE — H&P (Signed)
Russell Ho, DEFALCO MEDICAL RECORD NO: [id number] ACCOUNT NO: [id number] DATE OF BIRTH: 04-09-1950 FACILITY: [facility] LOCATION: [facility] PHYSICIAN: Debroah Baller, MD  History and Physical   DATE OF ADMISSION: 10/11/2022  HISTORY OF PRESENT ILLNESS:  The patient is a 72 year old white male with a diagnosis of prostate cancer.  He was noted to have an elevated PSA to 6 and underwent transrectal ultrasound and prostate biopsy.  Out of the 12 cores, 3 were positive with  Gleason 6 and 7, 2 at the right apex and 1 at the left base.  He underwent staging CT scan, which did not show any evidence of metastatic disease.  Prostate volume calculated at that time was 71 mL.  He has undergone 3 months of prostatic downsizing  using 5 alpha reductase.  At this time, he is ready to proceed with definitive therapy.  PAST MEDICAL HISTORY:  Significant for BPH and erectile dysfunction.  He has a history of hypertension, renal lithiasis.  He does have some sensorineural hearing loss bilaterally.  PAST SURGICAL HISTORY:  Previous surgeries include hernia repair 1954 and 1994.  He has history of previous lithotripsy of renal stones.  FAMILY HISTORY:  Shows a significance for coronary artery disease and colon cancer in one brother and breast cancer in a maternal grandmother.  SOCIAL HISTORY:  Shows no current alcohol use and he does not smoke.  PERTINENT MEDICATIONS:  Amlodipine 10 mg, atorvastatin 10 mg every day, Avodart 0.5 mg every day, multivitamin, and Flomax.  REVIEW OF SYSTEMS:  Shows no history of chest pain, shortness of breath, cough, fever, chills and unintended weight changes.  No bowel disturbance, no abdominal pain.  PHYSICAL EXAMINATION: GENERAL:  On physical exam, a well developed, well-nourished man in no acute distress. HEENT:  Normocephalic.  The eyes show pupils are round and reactive to light.  ENT does not show any mucosal irritation. LUNGS:  Clear to auscultation. BACK:  Back is  without CVA tenderness. HEART:  Sounds show regular rate and rhythm. ABDOMEN:  Nontender, without masses. GU: The penis is without lesions.  Both testes descended.  No external scrotal lesions. RECTAL:  Rectal exam reveals a 40 gram prostate, smooth, nontender.  Adequate sphincter tone. EXTREMITIES:  Without edema nor calf tenderness.  DIAGNOSTIC DATA:  Pertinent laboratories are pending.  ASSESSMENT:  Stage T1c adenocarcinoma of the prostate with Gleason score 3+4.  PLAN:  The risks and benefits of definitive therapy with radioactive seed implantation have been discussed, and questions answered.  The patient verbalizes understanding and wishes to proceed at this time.   MUK D: 10/10/2022 7:12:41 pm T: 10/10/2022 10:42:00 pm  JOB: [id number]/ [id number]

## 2022-10-11 NOTE — Discharge Instructions (Addendum)
Patient has appointment in morning for catheter removal in the office in [place] [facility] Instructions  Activity: Get plenty of rest for the remainder of the day. A responsible adult should stay with you for 24 hours following the procedure.  For the next 24 hours, DO NOT: -Drive a car -Advertising copywriter -Drink alcoholic beverages -Take any medication unless instructed by your physician -Make any legal decisions or sign important papers.  Meals: Start with liquid foods such as gelatin or soup. Progress to regular foods as tolerated. Avoid greasy, spicy, heavy foods. If nausea and/or vomiting occur, drink only clear liquids until the nausea and/or vomiting subsides. Call your physician if vomiting continues.  Special Instructions/Symptoms: Your throat may feel dry or sore from the anesthesia or the breathing tube placed in your throat during surgery. If this causes discomfort, gargle with warm salt water. The discomfort should disappear within 24 hours.

## 2022-10-11 NOTE — Brief Op Note (Addendum)
10/11/2022  3:57 PM  PATIENT:  Mel[place] Abts Tesoro  72 y.o. male  PRE-OPERATIVE DIAGNOSIS:  PROSTATE CANCER  POST-OPERATIVE DIAGNOSIS:  PROSTATE CANCER  PROCEDURE:  Procedure(s): RADIOACTIVE SEED IMPLANT/BRACHYTHERAPY IMPLANT (N/A) CYSTOSCOPY TRANSRECTAL ULTRASOUND  SURGEON:  Surgeons and Role:    Debroah Baller, MD - Primary    * Margaretmary Dys, MD  PHYSICIAN ASSISTANT:   ASSISTANTS: none   ANESTHESIA:   none  EBL:  2ml  BLOOD ADMINISTERED:none  DRAINS: Urinary Catheter (Foley)   LOCAL MEDICATIONS USED:  NONE  SPECIMEN:  No Specimen  DISPOSITION OF SPECIMEN:  N/A  COUNTS:  YES  TOURNIQUET:  * No tourniquets in log *  DICTATION: [id number]  PLAN OF CARE: Discharge to home after PACU  PATIENT DISPOSITION:  PACU - hemodynamically stable.   Delay start of Pharmacological VTE agent (>24hrs) due to surgical blood loss or risk of bleeding: not applicable

## 2022-10-11 NOTE — Anesthesia Preprocedure Evaluation (Signed)
Anesthesia Evaluation  Patient identified by MRN, date of birth, ID band Patient awake    Reviewed: Allergy & Precautions, NPO status , Patient's Chart, lab work & pertinent test results, reviewed documented beta blocker date and time   History of Anesthesia Complications Negative for: history of anesthetic complications  Airway Mallampati: III  TM Distance: >3 FB Neck ROM: Full    Dental  (+) Missing, Poor Dentition   Pulmonary neg COPD, neg PE   breath sounds clear to auscultation       Cardiovascular hypertension, (-) angina (-) CAD, (-) Past MI, (-) Cardiac Stents and (-) CABG (-) Valvular Problems/Murmurs Rhythm:Regular Rate:Normal     Neuro/Psych neg Headaches, neg Seizures PSYCHIATRIC DISORDERS  Depression       GI/Hepatic ,neg GERD  ,,(+) neg Cirrhosis        Endo/Other  neg diabetes    Renal/GU Renal disease     Musculoskeletal   Abdominal   Peds  Hematology   Anesthesia Other Findings   Reproductive/Obstetrics                              Anesthesia Physical Anesthesia Plan  ASA: 2  Anesthesia Plan: General   Post-op Pain Management:    Induction: Intravenous  PONV Risk Score and Plan: 2 and Ondansetron and Dexamethasone  Airway Management Planned: Oral ETT  Additional Equipment:   Intra-op Plan:   Post-operative Plan: Extubation in OR  Informed Consent: I have reviewed the patients History and Physical, chart, labs and discussed the procedure including the risks, benefits and alternatives for the proposed anesthesia with the patient or authorized representative who has indicated his/her understanding and acceptance.     Dental advisory given  Plan Discussed with: CRNA  Anesthesia Plan Comments:          Anesthesia Quick Evaluation

## 2022-10-11 NOTE — Transfer of Care (Signed)
Immediate Anesthesia Transfer of Care Note  Patient: Russell Ho  Procedure(s) Performed: RADIOACTIVE SEED IMPLANT/BRACHYTHERAPY IMPLANT (Prostate) CYSTOSCOPY (Urethra) TRANSRECTAL ULTRASOUND (Rectum)  Patient Location: PACU  Anesthesia Type:General  Level of Consciousness: drowsy  Airway & Oxygen Therapy: Patient Spontanous Breathing and Patient connected to nasal cannula oxygen  Post-op Assessment: Report given to RN  Post vital signs: Reviewed and stable  Last Vitals:  Vitals Value Taken Time  BP 147/93 10/11/22 1550  Temp 36.3 C 10/11/22 1551  Pulse 79 10/11/22 1552  Resp 8 10/11/22 1552  SpO2 99 % 10/11/22 1552  Vitals shown include unfiled device data.  Last Pain:  Vitals:   10/11/22 1200  TempSrc: Oral  PainSc: 0-No pain      Patients Stated Pain Goal: 4 (10/11/22 1200)  Complications: No notable events documented.

## 2022-10-11 NOTE — Telephone Encounter (Signed)
Returned patient's phone call, lvm for a return call 

## 2022-10-11 NOTE — Interval H&P Note (Signed)
History and Physical Interval Note:  10/11/2022 2:09 PM  Russell Ho  has presented today for surgery, with the diagnosis of PROSTATE CANCER.  The various methods of treatment have been discussed with the patient and family. After consideration of risks, benefits and other options for treatment, the patient has consented to  Procedure(s): RADIOACTIVE SEED IMPLANT/BRACHYTHERAPY IMPLANT (N/A) CYSTOSCOPY TRANSRECTAL ULTRASOUND as a surgical intervention.  The patient's history has been reviewed, patient examined, no change in status, stable for surgery.  I have reviewed the patient's chart and labs.  Questions were answered to the patient's satisfaction.     Russell Ho

## 2022-10-12 ENCOUNTER — Encounter (HOSPITAL_BASED_OUTPATIENT_CLINIC_OR_DEPARTMENT_OTHER): Payer: Self-pay | Admitting: Urology

## 2022-10-12 NOTE — Progress Notes (Signed)
Radiation Oncology         [phone] ________________________________  Name: CARBON TREADWAY MRN: [id number]  Date: 10/12/2022  DOB: 1950/09/28       Prostate Seed Implant  NW:GNFAOZHY, Excell Seltzer., MD  No ref. provider found  DIAGNOSIS:   72 y.o. gentleman with Stage T1c adenocarcinoma of the prostate with Gleason score of 3+4, and PSA of 6.0.   Oncology History  Malignant neoplasm of prostate (HCC)  06/21/2022 Cancer Staging   Staging form: Prostate, AJCC 8th Edition - Clinical stage from 06/21/2022: Stage IIB (cT1c, cN0, cM0, PSA: 6, Grade Group: 2) - Signed by Marcello Fennel, PA-C on 09/19/2022 Histopathologic type: Adenocarcinoma, NOS Stage prefix: Initial diagnosis Prostate specific antigen (PSA) range: Less than 10 Gleason primary pattern: 3 Gleason secondary pattern: 4 Gleason score: 7 Histologic grading system: 5 grade system Number of biopsy cores examined: 12 Number of biopsy cores positive: 3 Location of positive needle core biopsies: Both sides   08/08/2022 Initial Diagnosis   Malignant neoplasm of prostate (HCC)       ICD-10-CM   1. Pre-op testing  Z01.818 CBG per Guidelines for Diabetes Management for Patients Undergoing Surgery ([facility], [facility], and [facility] only)    CBG per protocol    I-Stat, Chem 8 on day of surgery per protocol    EKG 12 lead per protocol    EKG 12 lead per protocol    CBG per Guidelines for Diabetes Management for Patients Undergoing Surgery ([facility], [facility], and [facility] only)    CBG per protocol    I-Stat, Chem 8 on day of surgery per protocol    2. Malignant neoplasm of prostate (HCC)  C61 Urinalysis, Routine w reflex microscopic -Urine, Clean Catch    Urinalysis, Routine w reflex microscopic -Urine, Clean Catch      PROCEDURE: Insertion of radioactive I-125 seeds into the prostate gland.  RADIATION DOSE: 145 Gy, definitive therapy.  TECHNIQUE: JAQUINTON GERY was brought to the operating room with the urologist. He was placed in the dorsolithotomy  position. He was catheterized and a rectal tube was inserted. The perineum was shaved, prepped and draped. The ultrasound probe was then introduced by me into the rectum to see the prostate gland.  TREATMENT DEVICE: I attached the needle grid to the ultrasound probe stand and anchor needles were placed.  3D PLANNING: The prostate was imaged in 3D using a sagittal sweep of the prostate probe. These images were transferred to the planning computer. There, the prostate, urethra and rectum were defined on each axial reconstructed image. Then, the software created an optimized 3D plan and a few seed positions were adjusted. The quality of the plan was reviewed using [facility] information for the target and the following two organs at risk:  Urethra and Rectum.  Then the accepted plan was printed and handed off to the radiation therapist.  Under my supervision, the custom loading of the seeds and spacers was carried out using the quick loader.  These pre-loaded needles were then placed into the needle holder.Marland Kitchen  PROSTATE VOLUME STUDY:  Using transrectal ultrasound the volume of the prostate was verified to be 66 cc.  SPECIAL TREATMENT PROCEDURE/SUPERVISION AND HANDLING: The pre-loaded needles were then delivered by the urologist under sagittal guidance. A total of 19 needles were used to deposit 63 seeds in the prostate gland. The individual seed activity was 0.649 mCi.  SpaceOAR:  No  COMPLEX SIMULATION: At the end of the procedure, an anterior radiograph of the pelvis  was obtained to document seed positioning and count. Cystoscopy was performed by the urologist to check the urethra and bladder.  MICRODOSIMETRY: At the end of the procedure, the patient was emitting 0.167 mR/hr at 1 meter. Accordingly, he was considered safe for hospital discharge.  PLAN: The patient will return to the radiation oncology clinic for post implant CT dosimetry in three weeks.   ________________________________  Artist Pais  Kathrynn Running, M.D.

## 2022-10-30 ENCOUNTER — Telehealth: Payer: Self-pay | Admitting: *Deleted

## 2022-10-30 NOTE — Telephone Encounter (Signed)
CALLED PATIENT TO REMIND OF POST SEED APPTS. FOR 10-31-22, SPOKE WITH PATIENT AND HE IS AWARE OF THESE APPTS.

## 2022-10-31 ENCOUNTER — Encounter: Payer: Self-pay | Admitting: Urology

## 2022-10-31 ENCOUNTER — Other Ambulatory Visit: Payer: Self-pay

## 2022-10-31 ENCOUNTER — Ambulatory Visit
Admission: RE | Admit: 2022-10-31 | Discharge: 2022-10-31 | Disposition: A | Discharge status: Home / Self Care | Payer: Medicare Other | Source: Ambulatory Visit | Attending: Urology | Admitting: Urology

## 2022-10-31 ENCOUNTER — Ambulatory Visit
Admission: RE | Admit: 2022-10-31 | Discharge: 2022-10-31 | Disposition: A | Discharge status: Home / Self Care | Payer: Medicare Other | Source: Ambulatory Visit | Attending: Radiation Oncology | Admitting: Radiation Oncology

## 2022-10-31 VITALS — BP 132/80 | HR 78 | Temp 97.7°F | Resp 18 | Ht 69.0 in | Wt 197.0 lb

## 2022-10-31 DIAGNOSIS — Z79899 Other long term (current) drug therapy: Secondary | ICD-10-CM | POA: Insufficient documentation

## 2022-10-31 DIAGNOSIS — C61 Malignant neoplasm of prostate: Secondary | ICD-10-CM | POA: Insufficient documentation

## 2022-10-31 DIAGNOSIS — Z923 Personal history of irradiation: Secondary | ICD-10-CM | POA: Insufficient documentation

## 2022-10-31 NOTE — Progress Notes (Signed)
Post-seed nursing interview for a diagnosis of Stage T1c adenocarcinoma of the prostate with Gleason score of 3+4, and PSA of 6.0.  Patient identity verified x2.   Patient reports polyuria, and constipation. Patient denies all other related issues at this time.  Meaningful use complete.  I-PSS score- 8 - Mild Moderate SHIM score- 1- No sexual activity within the last 6 months. Urinary Management medication(s) Tamsulosin.v Urology appointment date- 11/05/2022 with Dr. Saddie Benders at [facility]  Vitals- BP 132/80 (BP Location: Left Arm, Patient Position: Sitting, Cuff Size: Normal)   Pulse 78   Temp 97.7 F (36.5 C) (Temporal)   Resp 18   Ht 5\' 9"  (1.753 m)   Wt 197 lb (89.4 kg)   SpO2 100%   BMI 29.09 kg/m   This concludes the interaction.  Ruel Favors, LPN

## 2022-10-31 NOTE — Progress Notes (Signed)
Radiation Oncology         [phone] ________________________________  Name: Russell Ho MRN: [id number]  Date: 10/31/2022  DOB: 08/10/1950  Post-Seed Follow-Up Visit Note  CC: Nonnie Done., MD  Debroah Baller, MD  Diagnosis:   72 y.o. gentleman with Stage T1c adenocarcinoma of the prostate with Gleason score of 3+4, and PSA of 6.0.     ICD-10-CM   1. Malignant neoplasm of prostate (HCC)  C61       Interval Since Last Radiation:  3 weeks 10/11/22:  Insertion of radioactive I-125 seeds into the prostate gland; 145 Gy, definitive therapy.  Narrative:  The patient returns today for routine follow-up.  He is complaining of increased urinary frequency and urinary hesitation symptoms. He filled out a questionnaire regarding urinary function today providing and overall IPSS score of 9 characterizing his symptoms as mild-moderate but tolerable and gradually improving.  He specifically denies dysuria, gross hematuria, straining to void or incontinence.  His pre-implant score was 3. He denies any abdominal pain or bowel symptoms currently but did have several weeks where he struggled with constipation following the procedure.  His bowels are pretty much back to normal at this point.  He has noticed some slight decrease in his stamina but has been able to remain active and overall, is quite pleased with his progress to date.  ALLERGIES:  is allergic to other.  Meds: Current Outpatient Medications  Medication Sig Dispense Refill   amLODipine (NORVASC) 10 MG tablet Take 10 mg by mouth daily.     atorvastatin (LIPITOR) 10 MG tablet Take 10 mg by mouth daily.     dutasteride (AVODART) 0.5 MG capsule Take 0.5 mg by mouth daily.     Multiple Vitamin (MULTIVITAMIN) tablet Take 1 tablet by mouth daily.     tamsulosin (FLOMAX) 0.4 MG CAPS capsule SMARTSIG:1 Capsule(s) By Mouth Every Evening     No current facility-administered medications for this encounter.    Physical Findings: In  general this is a well appearing Caucasian male in no acute distress. He's alert and oriented x4 and appropriate throughout the examination. Cardiopulmonary assessment is negative for acute distress and he exhibits normal effort.   Lab Findings: Lab Results  Component Value Date   HGB 16.0 10/11/2022   HCT 47.0 10/11/2022    Radiographic Findings:  Patient underwent CT imaging in our clinic for post implant dosimetry. The CT will be reviewed by Dr. Kathrynn Running to confirm there is an adequate distribution of radioactive seeds throughout the prostate gland and ensure that there are no seeds in or near the rectum.  We suspect the final radiation plan and dosimetry will show appropriate coverage of the prostate gland. He understands that we will call and inform him of any unexpected findings on further review of his imaging and dosimetry.  Impression/Plan: 72 y.o. gentleman with Stage T1c adenocarcinoma of the prostate with Gleason score of 3+4, and PSA of 6.0.  The patient is recovering from the effects of radiation. His urinary symptoms should gradually improve over the next 4-6 months. We talked about this today. He is encouraged by his improvement already and is otherwise pleased with his outcome. We also talked about long-term follow-up for prostate cancer following seed implant. He understands that ongoing PSA determinations and digital rectal exams will help perform surveillance to rule out disease recurrence. He has a follow up appointment scheduled with Dr. Saddie Benders on 11/05/2022. He understands what to expect with his PSA measures. Patient was  also educated today about some of the long-term effects from radiation including a small risk for rectal bleeding and possibly erectile dysfunction. We talked about some of the general management approaches to these potential complications. However, I did encourage the patient to contact our office or return at any point if he has questions or concerns related to  his previous radiation and prostate cancer.    Marguarite Arbour, PA-C

## 2022-10-31 NOTE — Progress Notes (Signed)
Radiation Oncology         [phone] ________________________________  Name: Russell Ho MRN: [id number]  Date: 10/31/2022  DOB: [birth year]  COMPLEX SIMULATION NOTE  NARRATIVE:  The patient was brought to the CT Simulation planning suite today following prostate seed implantation approximately one month ago.  Identity was confirmed.  All relevant records and images related to the planned course of therapy were reviewed.  Then, the patient was set-up supine.  CT images were obtained.  The CT images were loaded into the planning software.  Then the prostate and rectum were contoured.  Treatment planning then occurred.  The implanted iodine 125 seeds were identified by the physics staff for projection of radiation distribution  I have requested : 3D Simulation  I have requested a DVH of the following structures: Prostate and rectum.    ________________________________  Artist Pais Kathrynn Running, M.D.

## 2022-11-06 ENCOUNTER — Encounter: Payer: Self-pay | Admitting: Radiation Oncology

## 2022-11-06 ENCOUNTER — Encounter: Payer: Self-pay | Admitting: *Deleted

## 2022-11-06 DIAGNOSIS — C61 Malignant neoplasm of prostate: Secondary | ICD-10-CM | POA: Diagnosis not present

## 2022-11-06 NOTE — Radiation Completion Notes (Addendum)
  Radiation Oncology         [phone] ________________________________  Name: Russell Ho MRN: [id number]  Date: 11/06/2022  DOB: 30-Aug-1950  Referring Physician: GERMAIN BROTHERS, M.D. Date of Service: 2022-11-06 Radiation Oncologist: Adina Barge, M.D. [facility]     RADIATION ONCOLOGY END OF TREATMENT NOTE     Diagnosis:  72 y.o. gentleman with Stage T1c adenocarcinoma of the prostate with Gleason score of 3+4, and PSA of 6.0.   Intent: Curative     ==========DELIVERED PLANS==========  Prostate Seed Implant Date: 2022-10-11   Plan Name: Prostate Seed Implant Site: Prostate Technique: Radioactive Seed Implant I-125 Mode: Brachytherapy Dose Per Fraction: 145 Gy Prescribed Dose (Delivered / Prescribed): 145 Gy / 145 Gy Prescribed Fxs (Delivered / Prescribed): 1 / 1     ==========ON TREATMENT VISIT DATES========== 2022-10-11    The patient will receive a call in about one month from the radiation oncology department. He will continue follow up with Dr. BROTHERS as well.  ------------------------------------------------   [name], MD [facility]  Radiation Oncology Direct Dial: [phone]  Fax: [phone] [email or web address]  Skype  LinkedIn

## 2022-11-06 NOTE — Progress Notes (Signed)
Radiation Oncology         [phone] ________________________________  Name: Russell Ho MRN: [id number]  Date: 11/06/2022  DOB: [birth year]  3D Planning Note   Prostate Brachytherapy Post-Implant Dosimetry  Diagnosis: 72 y.o. gentleman with Stage T1c adenocarcinoma of the prostate with Gleason score of 3+4, and PSA of 6.0.   Narrative: On a previous date, Russell Ho returned following prostate seed implantation for post implant planning. He underwent CT scan complex simulation to delineate the three-dimensional structures of the pelvis and demonstrate the radiation distribution.  Since that time, the seed localization, and complex isodose planning with dose volume histograms have now been completed.  Results:   Prostate Coverage - The dose of radiation delivered to the 90% or more of the prostate gland (D90) was 115.28% of the prescription dose. This exceeds our goal of greater than 90%. Rectal Sparing - The volume of rectal tissue receiving the prescription dose or higher was 0.0 cc. This falls under our thresholds tolerance of 1.0 cc.  Impression: The prostate seed implant appears to show adequate target coverage and appropriate rectal sparing.  Plan:  The patient will continue to follow with urology for ongoing PSA determinations. I would anticipate a high likelihood for local tumor control with minimal risk for rectal morbidity.  ________________________________  Artist Pais Kathrynn Running, M.D.

## 2022-11-23 ENCOUNTER — Inpatient Hospital Stay: Payer: Medicare Other | Attending: Adult Health | Admitting: *Deleted

## 2022-11-23 ENCOUNTER — Encounter: Payer: Self-pay | Admitting: *Deleted

## 2022-11-23 DIAGNOSIS — C61 Malignant neoplasm of prostate: Secondary | ICD-10-CM

## 2022-11-23 NOTE — Progress Notes (Signed)
SCP reviewed and completed. Pt will have post PSA labs drawn on Jan.6, 2025 with Dr. Saddie Benders. A copy of treatment summary was sent to PCP.
